# Patient Record
Sex: Male | Born: 2000
Health system: Southern US, Community
[De-identification: ages and names within clinical notes are randomized; demographics above are authoritative.]

---
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
## 2020-06-25 DIAGNOSIS — Z202 Contact with and (suspected) exposure to infections with a predominantly sexual mode of transmission: Secondary | ICD-10-CM | POA: Diagnosis not present

## 2020-06-25 DIAGNOSIS — Z20822 Contact with and (suspected) exposure to covid-19: Secondary | ICD-10-CM | POA: Diagnosis not present

## 2020-06-25 MED FILL — AZITHROMYCIN 500 MG TABLET: 500 | 1 days supply | Qty: 2 | Fill #0

## 2020-11-02 DIAGNOSIS — J069 Acute upper respiratory infection, unspecified: Secondary | ICD-10-CM | POA: Diagnosis not present

## 2020-11-02 DIAGNOSIS — N342 Other urethritis: Secondary | ICD-10-CM | POA: Diagnosis not present

## 2021-01-01 DIAGNOSIS — Z20822 Contact with and (suspected) exposure to covid-19: Secondary | ICD-10-CM | POA: Diagnosis not present

## 2023-01-10 DIAGNOSIS — R300 Dysuria: Secondary | ICD-10-CM | POA: Diagnosis not present

## 2023-01-26 DIAGNOSIS — Z202 Contact with and (suspected) exposure to infections with a predominantly sexual mode of transmission: Secondary | ICD-10-CM | POA: Diagnosis not present

## 2023-01-26 DIAGNOSIS — A749 Chlamydial infection, unspecified: Secondary | ICD-10-CM | POA: Diagnosis not present

## 2023-02-06 ENCOUNTER — Other Ambulatory Visit: Payer: Self-pay | Admitting: Physician Assistant

## 2023-02-06 DIAGNOSIS — N50811 Right testicular pain: Secondary | ICD-10-CM

## 2023-02-23 ENCOUNTER — Emergency Department (HOSPITAL_BASED_OUTPATIENT_CLINIC_OR_DEPARTMENT_OTHER)
Admission: EM | Admit: 2023-02-23 | Discharge: 2023-02-23 | Disposition: A | Discharge status: Home / Self Care | Payer: Commercial Managed Care - PPO | Attending: Emergency Medicine | Admitting: Emergency Medicine

## 2023-02-23 ENCOUNTER — Encounter (HOSPITAL_BASED_OUTPATIENT_CLINIC_OR_DEPARTMENT_OTHER): Payer: Self-pay | Admitting: Emergency Medicine

## 2023-02-23 ENCOUNTER — Other Ambulatory Visit: Payer: Self-pay

## 2023-02-23 ENCOUNTER — Emergency Department (HOSPITAL_BASED_OUTPATIENT_CLINIC_OR_DEPARTMENT_OTHER): Payer: Commercial Managed Care - PPO

## 2023-02-23 DIAGNOSIS — N50811 Right testicular pain: Secondary | ICD-10-CM

## 2023-02-23 LAB — URINALYSIS, ROUTINE W REFLEX MICROSCOPIC
Bilirubin Urine: NEGATIVE
Glucose, UA: NEGATIVE mg/dL
Hgb urine dipstick: NEGATIVE
Ketones, ur: NEGATIVE mg/dL
Leukocytes,Ua: NEGATIVE
Nitrite: NEGATIVE
Protein, ur: NEGATIVE mg/dL
Specific Gravity, Urine: 1.021 (ref 1.005–1.030)
pH: 6.5 (ref 5.0–8.0)

## 2023-02-23 NOTE — ED Notes (Signed)
US at bedside

## 2023-02-23 NOTE — ED Triage Notes (Addendum)
Was on cipro  for STD and now his rt testicle  has been swollen x 2 -3 weeks , hurts to pee  and start and stop was to have [place] on the 14

## 2023-02-23 NOTE — Discharge Instructions (Signed)
You have been evaluated for your symptoms.  Fortunately your urine did not show any signs of infection and ultrasound of your scrotum and testicle did not show any concerning finding to suggest epididymitis, testicular torsion, or other concerning feature.  Your symptoms may be results of previous sexually transmitted infection.  Please wear protection during sex, monitor for any signs of worsening symptoms and return to the ER if you have any concern.  Otherwise you may follow-up with urologist as needed.

## 2023-02-23 NOTE — ED Provider Notes (Addendum)
[facility] Provider Note   CSN: [id number] Arrival date & time: 02/23/23  1529     History  Chief Complaint  Patient presents with   Testicle Pain    Damon Lambert is a 22 y.o. male.  The history is provided by the patient and medical records. No language interpreter was used.  Testicle Pain     Patient is a 22 year old male presenting to the ED with testicular swelling and pain. Patient states he was diagnosed with Chlamydia end of January and was prescribed Doxycycline. Patient states he went to urgent care mid February because he was having testicular pain and swelling. They treated him with epididymitis with Ceftriaxone. Patient finish the course of both antibiotics. Patient states his pain has improved but continues to have discomfort and testicular pain especially on the right testicle. Patient states he was the urgency to urinate more frequency and reports some discomfort when urinating. Patient states he was doing some heavy lifting and states that may have cause of the symptoms he was having. Patient states he does not have new sexual partners. He denies fever, chills, shortness of breath, chest pain, nausea, vomiting or rectal discomfort.   Home Medications Prior to Admission medications   Not on File      Allergies    Patient has no known allergies.    Review of Systems   Review of Systems  Genitourinary:  Positive for testicular pain.  All other systems reviewed and are negative.   Physical Exam Updated Vital Signs BP (!) 144/59 (BP Location: Right Arm)   Pulse 75   Temp 98.2 F (36.8 C)   Resp 16   Ht '6\' 1"'$  (1.854 m)   Wt 88.5 kg   SpO2 100%   BMI 25.73 kg/m  Physical Exam Vitals and nursing note reviewed.  Constitutional:      General: He is not in acute distress.    Appearance: He is well-developed.  HENT:     Head: Atraumatic.  Eyes:     Conjunctiva/sclera: Conjunctivae normal.  Abdominal:      Palpations: Abdomen is soft.     Tenderness: There is no abdominal tenderness.  Genitourinary:    Penis: Normal.      Testes: Normal.     Comments: Chaperone present during exam Musculoskeletal:     Cervical back: Neck supple.  Skin:    Findings: No rash.  Neurological:     Mental Status: He is alert. Mental status is at baseline.     ED Results / Procedures / Treatments   Labs (all labs ordered are listed, but only abnormal results are displayed) Labs Reviewed  URINALYSIS, ROUTINE W REFLEX MICROSCOPIC    EKG None  Radiology US SCROTUM W/DOPPLER  Result Date: 02/23/2023 CLINICAL DATA:  Two to three-week history of right testicular pain and swelling EXAM: SCROTAL ULTRASOUND DOPPLER ULTRASOUND OF THE TESTICLES TECHNIQUE: Complete ultrasound examination of the testicles, epididymis, and other scrotal structures was performed. Color and spectral Doppler ultrasound were also utilized to evaluate blood flow to the testicles. COMPARISON:  None Available. FINDINGS: Right testicle Measurements: 4.9 x 2.7 x 2.6 cm, 18.1 mL. No mass or microlithiasis visualized. Left testicle Measurements: 4.5 x 2.6 x 2.4 cm, 14.1 mL. No mass or microlithiasis visualized. Right epididymis:  Normal in size and appearance. Left epididymis:  Normal in size and appearance. Hydrocele:  None visualized. Varicocele:  None visualized. Pulsed Doppler interrogation of both testes demonstrates normal low  resistance arterial and venous waveforms bilaterally. IMPRESSION: Normal testicular ultrasound examination. Electronically Signed   By: Darrin Nipper M.D.   On: 02/23/2023 16:44    Procedures Procedures    Medications Ordered in ED Medications - No data to display  ED Course/ Medical Decision Making/ A&P                             Medical Decision Making Amount and/or Complexity of Data Reviewed Labs: ordered. Radiology: ordered.   BP (!) 144/59 (BP Location: Right Arm)   Pulse 75   Temp 98.2 F (36.8 C)    Resp 16   Ht '6\' 1"'$  (1.854 m)   Wt 88.5 kg   SpO2 100%   BMI 25.73 kg/m   30:24 PM 22 year old male presenting with complaints of testicle discomfort.  Patient states he has been diagnosed with STI multiple times in the past.  He was diagnosed with chlamydia infection back in January of this year.  He was prescribed doxycycline for which he finished the course but noticed some right-sided testicular pain.  He went to urgent care center on February 18 for evaluation.  States at that time they diagnosed him with epididymitis and prescribed additional antibiotic.  He finished the antibiotic and states symptoms seem to be improving but recently he did notice some increasing right testicular pain and also noticed some change to his ejaculate.  He does not complain of any fever or chills no abdominal pain no back pain no penile discharge or rash.  He did perform some heavy lifting recently and was unsure if that may have aggravated his testicle.  He denies any new sexual partner.  On exam, this is overall well-appearing male laying in bed appears to be in no acute discomfort.  Abdomen is soft nontender, chaperone including PA student was in the room for evaluation.  Patient has no inguinal lymphadenopathy or inguinal hernia noted.  He has a circumcised penis free of lesion or rash.  His testicles are nontender with normal lie, normal scrotum.  DDx include epididymitis, testicular torsion, urethritis, UTI, hernia.  Possible admission considered, will obtain labs and ultrasound to help determine disposition.  Labs and imaging obtained independently reviewed interpreted by me and I agree with radiology interpretation.  Urinalysis did not show any evidence of infection.  Ultrasound of the scrotum did show no evidence of testicular torsion, epididymitis, orchitis, or other concerning changes.  At this time no emergent medical condition identified.  Patient discomfort may be secondary to previous STIs causing  Scarring.  I offered patient outpatient follow-up with urology as needed but otherwise I do not think additional antibiotic is indicated.  Recommend supportive care outpatient including taking OTC medication just as Tylenol ibuprofen for pain as needed.          Final Clinical Impression(s) / ED Diagnoses Final diagnoses:  Right testicular pain    Rx / DC Orders ED Discharge Orders     None         Domenic Moras, PA-C 02/23/23 1705    Domenic Moras, PA-C 02/23/23 1710    Malvin Johns, MD 02/23/23 1900

## 2023-03-02 ENCOUNTER — Other Ambulatory Visit: Payer: Commercial Managed Care - PPO
